# Patient Record
Sex: Male | Born: 1980
Health system: Southern US, Community
[De-identification: ages and names within clinical notes are randomized; demographics above are authoritative.]

---

## 2020-03-13 ENCOUNTER — Ambulatory Visit (INDEPENDENT_AMBULATORY_CARE_PROVIDER_SITE_OTHER): Payer: 59 | Admitting: Orthopedic Surgery

## 2020-03-13 ENCOUNTER — Ambulatory Visit (INDEPENDENT_AMBULATORY_CARE_PROVIDER_SITE_OTHER): Payer: 59

## 2020-03-13 DIAGNOSIS — M79605 Pain in left leg: Secondary | ICD-10-CM | POA: Diagnosis not present

## 2020-03-13 DIAGNOSIS — M25572 Pain in left ankle and joints of left foot: Secondary | ICD-10-CM

## 2020-03-17 ENCOUNTER — Encounter: Payer: Self-pay | Admitting: Orthopedic Surgery

## 2020-03-17 NOTE — Progress Notes (Signed)
Office Visit Note   Patient: Mark Hubbard           Date of Birth: 22-Aug-1981           MRN: [id number] Visit Date: 03/13/2020 Requested by: No referring provider defined for this encounter. PCP: Patient, No Pcp Per  Subjective: Chief Complaint  Patient presents with  . Left Ankle - Pain    HPI: Mark Hubbard is a 39 y.o. male who presents to the office complaining of left ankle pain.  Patient notes pain over the last month.  He had acute onset with no injury.  He had onset of left ankle pain when he was waiting a maternity ward for his wife to deliver.  He localizes pain to the medial aspect of the left ankle.  He did develop some focal medial swelling and redness.  He has no history of significant left ankle pain.  He has had a couple sprains in the past but no other injuries that he can recall.  He works as a Marine scientist.  He has tried Keflex and prednisone that provided some relief but did not resolve his symptoms.  He denies any history of gout.  He takes occasional Advil.  He initially was feeling the pain that he had to ambulate with a limp.  Swelling was reduced with prednisone course prescribed by general medical physician with whom he is related.  But recently returned about a week ago.  He denies any recent illnesses.  He initially had redness around the area of pain but this has resolved as well..                ROS: All systems reviewed are negative as they relate to the chief complaint within the history of present illness.  Patient denies fevers or chills.  Assessment & Plan: Visit Diagnoses:  1. Pain of joint of left ankle and foot   2. 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  He has had no injury.  Localizes pain to the medial aspect of the left ankle and has tenderness over the posterior tibialis tendon.  He has tried a prednisone course with some relief of pain but  has not resolved his symptoms.  Radiographs of the left ankle taken today are negative for any acute findings.  Ordered MRI of the left ankle for further evaluation of the medial malleolus and posterior tibialis tendon.  Patient agreed with plan.  Follow-up after MRI to review results.  Follow-Up Instructions: No follow-ups on file.   Orders:  Orders Placed This Encounter  Procedures  . XR Ankle Complete Left  . MR Ankle Left w/o contrast   No orders of the defined types were placed in this encounter.     Procedures: No procedures performed   Clinical Data: No additional findings.  Objective: Vital Signs: There were no vitals taken for this visit.  Physical Exam:  Constitutional: Patient appears well-developed HEENT:  Head: Normocephalic Eyes:EOM are normal Neck: Normal range of motion Cardiovascular: Normal rate Pulmonary/chest: Effort normal Neurologic: Patient is alert Skin: Skin is warm Psychiatric: Patient has normal mood and affect  Ortho Exam: Ortho exam demonstrates left ankle tenderness to palpation posterior to the medial malleolus.  Mild tenderness palpation over the insertion of the posterior tibialis tendon.  No tenderness palpation over the medial lateral malleoli, anterior ankle joint, anterior tibialis tendon, Achilles tendon, retrocalcaneal  space, fifth metatarsal base, Lisfranc complex, dorsal midfoot.  No pain with syndesmotic squeeze.  Active dorsiflexion, plantarflexion, inversion, eversion are intact.  No severe flatfoot deformity noted.  Specialty Comments:  No specialty comments available.  Imaging: No results found.   PMFS History: There are no problems to display for this patient.  No past medical history on file.  No family history on file.  History reviewed. No pertinent surgical history. Social History   Occupational History  . Not on file  Tobacco Use  . Smoking status: Not on file  Substance and Sexual Activity  . Alcohol use:  Not on file  . Drug use: Not on file  . Sexual activity: Not on file

## 2020-04-08 ENCOUNTER — Other Ambulatory Visit: Payer: 59

## 2020-05-08 ENCOUNTER — Other Ambulatory Visit: Payer: 59

## 2020-05-19 ENCOUNTER — Other Ambulatory Visit: Payer: Self-pay

## 2020-05-19 ENCOUNTER — Ambulatory Visit: Payer: 59 | Attending: Internal Medicine

## 2020-05-19 DIAGNOSIS — Z23 Encounter for immunization: Secondary | ICD-10-CM

## 2020-05-19 NOTE — Progress Notes (Signed)
   Covid-19 Vaccination Clinic  Name:  Mark Hubbard    MRN: [id number] DOB: 1981-07-06  05/19/2020  Mark Hubbard was observed post Covid-19 immunization for 15 minutes without incident. He was provided with Vaccine Information Sheet and instruction to access the V-Safe system.   Mark Hubbard was instructed to call 911 with any severe reactions post vaccine: Marland Kitchen Difficulty breathing  . Swelling of face and throat  . A fast heartbeat  . A bad rash all over body  . Dizziness and weakness

## 2020-07-09 DIAGNOSIS — H52222 Regular astigmatism, left eye: Secondary | ICD-10-CM | POA: Diagnosis not present

## 2020-07-09 DIAGNOSIS — H5211 Myopia, right eye: Secondary | ICD-10-CM | POA: Diagnosis not present

## 2020-07-09 DIAGNOSIS — H5212 Myopia, left eye: Secondary | ICD-10-CM | POA: Diagnosis not present

## 2020-07-11 DIAGNOSIS — Z20822 Contact with and (suspected) exposure to covid-19: Secondary | ICD-10-CM | POA: Diagnosis not present

## 2020-10-04 ENCOUNTER — Other Ambulatory Visit (HOSPITAL_COMMUNITY): Payer: Self-pay | Admitting: Occupational Medicine

## 2020-10-04 MED FILL — TIVICAY 50 MG TABLET: 50 | 5 days supply | Qty: 5 | Fill #0

## 2020-10-04 MED FILL — DESCOVY 200-25 MG TABS: 200-25 | 5 days supply | Qty: 5 | Fill #0

## 2020-10-07 ENCOUNTER — Other Ambulatory Visit (HOSPITAL_COMMUNITY): Payer: Self-pay | Admitting: Occupational Medicine

## 2020-10-07 MED FILL — ONDANSETRON HCL 4 MG TABLET: 4 | 3 days supply | Qty: 10 | Fill #0

## 2020-10-07 MED FILL — TIVICAY 50 MG TABLET: 50 | 23 days supply | Qty: 46 | Fill #0

## 2020-10-08 MED FILL — SYMTUZA 800-150-200-10 MG T: 800-150-200 | 23 days supply | Qty: 23 | Fill #0

## 2020-10-29 ENCOUNTER — Other Ambulatory Visit (HOSPITAL_COMMUNITY): Payer: Self-pay | Admitting: Occupational Medicine

## 2020-10-29 MED FILL — SYMTUZA 800-150-200-10 MG T: 800-150-200 | 2 days supply | Qty: 2 | Fill #0

## 2020-10-29 MED FILL — TIVICAY 50 MG TABLET: 50 | 4 days supply | Qty: 4 | Fill #0

## 2021-01-06 ENCOUNTER — Ambulatory Visit: Payer: 59 | Admitting: Family Medicine

## 2021-01-08 ENCOUNTER — Other Ambulatory Visit: Payer: Self-pay

## 2021-01-08 ENCOUNTER — Encounter: Payer: Self-pay | Admitting: Orthopaedic Surgery

## 2021-01-08 ENCOUNTER — Ambulatory Visit (INDEPENDENT_AMBULATORY_CARE_PROVIDER_SITE_OTHER): Payer: 59

## 2021-01-08 ENCOUNTER — Ambulatory Visit (INDEPENDENT_AMBULATORY_CARE_PROVIDER_SITE_OTHER): Payer: 59 | Admitting: Orthopaedic Surgery

## 2021-01-08 DIAGNOSIS — M25561 Pain in right knee: Secondary | ICD-10-CM | POA: Diagnosis not present

## 2021-01-08 NOTE — Progress Notes (Signed)
Office Visit Note   Patient: Mark Hubbard           Date of Birth: 1980-09-08           MRN: [id number] Visit Date: 01/08/2021              Requested by: No referring provider defined for this encounter. PCP: Patient, No Pcp Per (Inactive)   Assessment & Plan: Visit Diagnoses:  1. Acute pain of right knee     Plan: Impression is MCL sprain likely grade 1.  X-rays are unremarkable.  I would recommend symptomatic treatment with ice and NSAIDs and he will continue to work on getting his knee straight.  I did hand him home exercises to try to do once he starts to feel improvement.  If he does not feel any improvement over the next 3 to 4 weeks we may need to consider an MRI.  Follow-Up Instructions: Return if symptoms worsen or fail to improve.   Orders:  Orders Placed This Encounter  Procedures  . XR Knee 1-2 Views Right   No orders of the defined types were placed in this encounter.     Procedures: No procedures performed   Clinical Data: No additional findings.   Subjective: Chief Complaint  Patient presents with  . 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  He was going down steps when he missed the edge of the steps and slipped down a few of the steps and then planted with the knee in valgus.  He is not sure if it ever swelled up.  He has noticed some decreased range of motion especially with full extension.  He has been limping as a result of inability to fully extend his knee.  He was taking ibuprofen at beginning.  Not really endorsing any mechanical symptoms.  He does feel some instability when he is changing directions.   Review of Systems  Constitutional: Negative.   All other systems reviewed and are negative.    Objective: Vital Signs: There were no vitals taken for this visit.  Physical Exam Vitals and  nursing note reviewed.  Constitutional:      Appearance: He is well-developed.  HENT:     Head: Normocephalic and atraumatic.  Eyes:     Pupils: Pupils are equal, round, and reactive to light.  Pulmonary:     Effort: Pulmonary effort is normal.  Abdominal:     Palpations: Abdomen is soft.  Musculoskeletal:        General: Normal range of motion.     Cervical back: Neck supple.  Skin:    General: Skin is warm.  Neurological:     Mental Status: He is alert and oriented to person, place, and time.  Psychiatric:        Behavior: Behavior normal.        Thought Content: Thought content normal.        Judgment: Judgment normal.     Ortho Exam Right knee shows no joint effusion.  He is tender over the femoral insertion of the MCL.  There is no laxity to the collaterals or cruciates.  He has essentially normal flexion but he lacks about 15 to 20 degrees of full extension due to pain and guarding but I feel that without the pain I would be able to get him  fully straight.  No significant medial joint line tenderness.  Negative McMurray.  Posterior lateral corner feels stable.  Lateral tibial plateau is nontender.  Patella tracking is normal. Specialty Comments:  No specialty comments available.  Imaging: XR Knee 1-2 Views Right  Result Date: 01/08/2021 No acute or structural abnormalities.    PMFS History: There are no problems to display for this patient.  History reviewed. No pertinent past medical history.  History reviewed. No pertinent family history.  History reviewed. No pertinent surgical history. Social History   Occupational History  . Not on file  Tobacco Use  . Smoking status: Not on file  . Smokeless tobacco: Not on file  Substance and Sexual Activity  . Alcohol use: Not on file  . Drug use: Not on file  . Sexual activity: Not on file

## 2021-01-27 ENCOUNTER — Other Ambulatory Visit: Payer: Self-pay

## 2021-01-27 ENCOUNTER — Telehealth: Payer: Self-pay | Admitting: Orthopaedic Surgery

## 2021-01-27 DIAGNOSIS — M25561 Pain in right knee: Secondary | ICD-10-CM

## 2021-01-27 NOTE — Telephone Encounter (Signed)
Yes please thanks.

## 2021-01-27 NOTE — Telephone Encounter (Signed)
Okay to do order ?  

## 2021-01-27 NOTE — Telephone Encounter (Signed)
Patient called requesting Dr. Roda Shutters send a referral for MRI. Please call patient about this matter at [phone]

## 2021-01-27 NOTE — Telephone Encounter (Signed)
Order made. They will contact patient to schedule appt .

## 2021-02-05 ENCOUNTER — Other Ambulatory Visit: Payer: Self-pay

## 2021-02-05 ENCOUNTER — Ambulatory Visit
Admission: RE | Admit: 2021-02-05 | Discharge: 2021-02-05 | Disposition: A | Discharge status: Home / Self Care | Payer: 59 | Source: Ambulatory Visit | Attending: Orthopaedic Surgery | Admitting: Orthopaedic Surgery

## 2021-02-05 DIAGNOSIS — S8001XA Contusion of right knee, initial encounter: Secondary | ICD-10-CM | POA: Diagnosis not present

## 2021-02-05 DIAGNOSIS — M25561 Pain in right knee: Secondary | ICD-10-CM

## 2021-05-27 ENCOUNTER — Other Ambulatory Visit (HOSPITAL_COMMUNITY): Payer: Self-pay

## 2021-05-27 MED ORDER — AZITHROMYCIN 250 MG PO TABS
ORAL_TABLET | ORAL | 0 refills | Status: AC
  Filled 2021-05-27: qty 6, 5d supply, fill #0

## 2021-08-25 ENCOUNTER — Other Ambulatory Visit: Payer: Self-pay | Admitting: Internal Medicine

## 2021-09-10 ENCOUNTER — Encounter (HOSPITAL_COMMUNITY): Payer: Self-pay | Admitting: Emergency Medicine

## 2021-09-10 ENCOUNTER — Ambulatory Visit (HOSPITAL_COMMUNITY)
Admission: RE | Admit: 2021-09-10 | Discharge: 2021-09-10 | Disposition: A | Discharge status: Home / Self Care | Payer: BC Managed Care – PPO | Source: Ambulatory Visit | Attending: Student | Admitting: Student

## 2021-09-10 ENCOUNTER — Other Ambulatory Visit: Payer: Self-pay

## 2021-09-10 ENCOUNTER — Telehealth: Payer: Self-pay | Admitting: Student

## 2021-09-10 ENCOUNTER — Emergency Department (HOSPITAL_COMMUNITY)
Admission: EM | Admit: 2021-09-10 | Discharge: 2021-09-10 | Disposition: A | Discharge status: Home / Self Care | Payer: BC Managed Care – PPO | Attending: Emergency Medicine | Admitting: Emergency Medicine

## 2021-09-10 DIAGNOSIS — R059 Cough, unspecified: Secondary | ICD-10-CM | POA: Diagnosis not present

## 2021-09-10 DIAGNOSIS — R011 Cardiac murmur, unspecified: Secondary | ICD-10-CM | POA: Diagnosis not present

## 2021-09-10 DIAGNOSIS — J189 Pneumonia, unspecified organism: Secondary | ICD-10-CM

## 2021-09-10 DIAGNOSIS — R0789 Other chest pain: Secondary | ICD-10-CM | POA: Diagnosis not present

## 2021-09-10 DIAGNOSIS — R0602 Shortness of breath: Secondary | ICD-10-CM | POA: Diagnosis not present

## 2021-09-10 DIAGNOSIS — R072 Precordial pain: Secondary | ICD-10-CM | POA: Insufficient documentation

## 2021-09-10 LAB — CBC WITH DIFFERENTIAL/PLATELET
Abs Immature Granulocytes: 0.02 10*3/uL (ref 0.00–0.07)
Basophils Absolute: 0 10*3/uL (ref 0.0–0.1)
Basophils Relative: 0 %
Eosinophils Absolute: 0 10*3/uL (ref 0.0–0.5)
Eosinophils Relative: 0 %
HCT: 44.7 % (ref 39.0–52.0)
Hemoglobin: 14.7 g/dL (ref 13.0–17.0)
Immature Granulocytes: 0 %
Lymphocytes Relative: 17 %
Lymphs Abs: 1.4 10*3/uL (ref 0.7–4.0)
MCH: 29.5 pg (ref 26.0–34.0)
MCHC: 32.9 g/dL (ref 30.0–36.0)
MCV: 89.8 fL (ref 80.0–100.0)
Monocytes Absolute: 1 10*3/uL (ref 0.1–1.0)
Monocytes Relative: 12 %
Neutro Abs: 5.7 10*3/uL (ref 1.7–7.7)
Neutrophils Relative %: 71 %
Platelets: 257 10*3/uL (ref 150–400)
RBC: 4.98 MIL/uL (ref 4.22–5.81)
RDW: 12.7 % (ref 11.5–15.5)
WBC: 8.2 10*3/uL (ref 4.0–10.5)
nRBC: 0 % (ref 0.0–0.2)

## 2021-09-10 LAB — COMPREHENSIVE METABOLIC PANEL
ALT: 19 U/L (ref 0–44)
AST: 21 U/L (ref 15–41)
Albumin: 4.2 g/dL (ref 3.5–5.0)
Alkaline Phosphatase: 51 U/L (ref 38–126)
Anion gap: 5 (ref 5–15)
BUN: 19 mg/dL (ref 6–20)
CO2: 26 mmol/L (ref 22–32)
Calcium: 9.2 mg/dL (ref 8.9–10.3)
Chloride: 108 mmol/L (ref 98–111)
Creatinine, Ser: 0.71 mg/dL (ref 0.61–1.24)
GFR, Estimated: >60 mL/min (ref >60)
Glucose, Bld: 110 mg/dL — ABNORMAL HIGH (ref 70–99)
Potassium: 3.9 mmol/L (ref 3.5–5.1)
Sodium: 139 mmol/L (ref 135–145)
Total Bilirubin: 1 mg/dL (ref 0.3–1.2)
Total Protein: 7.1 g/dL (ref 6.5–8.1)

## 2021-09-10 LAB — TROPONIN I (HIGH SENSITIVITY): Troponin I (High Sensitivity): 3 ng/L (ref <18)

## 2021-09-10 LAB — D-DIMER, QUANTITATIVE: D-Dimer, Quant: <0.27 ug/mL-FEU (ref 0.00–0.50)

## 2021-09-10 NOTE — ED Provider Triage Note (Addendum)
Emergency Medicine Provider Triage Evaluation Note  Mark Hubbard , a 41 y.o. male  was evaluated in triage.  Pt complains of chest pain.  Pt has pain on right side.  Pain began suddenly at 4am.   Review of Systems  Positive: cough Negative: No fever  Physical Exam  BP (!) 159/95    Pulse 83    Temp 98.3 F (36.8 C) (Oral)    Resp 16    SpO2 100% Gen:   Awake, no distress   Resp:  Normal effort.  Rub left chest  MSK:   Moves extremities without difficulty  Other:    Medical Decision Making  Medically screening exam initiated at 3:00 PM.  Appropriate orders placed.  Mark Hubbard was informed that the remainder of the evaluation will be completed by another provider, this initial triage assessment does not replace that evaluation, and the importance of remaining in the ED until their evaluation is complete.  Chest xray shows possible right lobe pneumonia.  Radiologist request lateral view Charge Rn asked to room pt    Mark Hubbard 09/10/21 [place], [place] 09/10/21 [id number]

## 2021-09-10 NOTE — Discharge Instructions (Signed)
Please follow-up with cardiology as planned.  Return with any new or suddenly worsening symptoms.

## 2021-09-10 NOTE — ED Provider Notes (Signed)
Emergency Department Provider Note   I have reviewed the triage vital signs and the nursing notes.   HISTORY  Chief Complaint Chest Pain   HPI Mark Hubbard is a 41 y.o. male presents to the ED with acute onset CP starting a 3:30 AM. Patient notes this woke him from sleep. Describes a sharp CP radiating into the right arm. No numbness/weakness. Notes a slight pleuritic component. No leg pain or swelling. No recent travel or surgery. No recent URI.     History reviewed. No pertinent past medical history.  Review of Systems  Constitutional: No fever/chills Eyes: No visual changes. ENT: No sore throat. Cardiovascular: Positive chest pain. Respiratory: Denies shortness of breath. Gastrointestinal: No abdominal pain.  No nausea, no vomiting.  No diarrhea.  No constipation. Genitourinary: Negative for dysuria. Musculoskeletal: Negative for back pain. Skin: Negative for rash. Neurological: Negative for headaches, focal weakness or numbness.  10-point ROS otherwise negative.  ____________________________________________   PHYSICAL EXAM:  VITAL SIGNS: ED Triage Vitals  Enc Vitals Group     BP 09/10/21 1410 (!) 159/95     Pulse Rate 09/10/21 1410 83     Resp 09/10/21 1410 16     Temp 09/10/21 1410 98.3 F (36.8 C)     Temp Source 09/10/21 1410 Oral     SpO2 09/10/21 1410 100 %   Constitutional: Alert and oriented. Well appearing and in no acute distress. Eyes: Conjunctivae are normal.  Head: Atraumatic. Nose: No congestion/rhinnorhea. Mouth/Throat: Mucous membranes are moist.  Neck: No stridor.  Cardiovascular: Normal rate, regular rhythm. Good peripheral circulation. Diastolic murmur noted with faint rub best heard in the left middle chest.   Respiratory: Normal respiratory effort.  No retractions. Lungs CTAB. Gastrointestinal: Soft and nontender. No distention.  Musculoskeletal: No gross deformities of extremities. Neurologic:  Normal speech and language.   Skin:  Skin is warm, dry and intact. No rash noted.   ____________________________________________   LABS (all labs ordered are listed, but only abnormal results are displayed)  Labs Reviewed  COMPREHENSIVE METABOLIC PANEL - Abnormal; Notable for the following components:      Result Value   Glucose, Bld 110 (*)    All other components within normal limits  RESP PANEL BY RT-PCR (FLU A&B, COVID) ARPGX2  CBC WITH DIFFERENTIAL/PLATELET  D-DIMER, QUANTITATIVE  TROPONIN I (HIGH SENSITIVITY)   ____________________________________________  EKG   EKG Interpretation  Date/Time:  Wednesday September 10 2021 18:44:00 EST Ventricular Rate:  91 PR Interval:  164 QRS Duration: 88 QT Interval:  348 QTC Calculation: 428 R Axis:   79 Text Interpretation: Normal sinus rhythm Septal infarct , age undetermined Abnormal ECG No previous ECGs available Confirmed by Richardean Canal [phone]) on 09/11/2021 6:22:42 PM        ____________________________________________  RADIOLOGY  DG Chest 1 View  Result Date: 09/10/2021 CLINICAL DATA:  Cough and shortness of breath EXAM: CHEST  1 VIEW COMPARISON:  None. FINDINGS: Heart size is normal. Mediastinal shadows are normal. Heart border is indistinct on the right, suggesting right middle lobe pneumonia. There may also be minimal patchy density at the left base. Lateral view would be useful, as these findings can be seen with narrow AP diameter of the chest. No effusion. No focal bone finding. IMPRESSION: Loss of the right heart border suggesting right middle lobe infiltrate. Question minimal patchy density at the left base. Lateral view would be useful for complete evaluation. Electronically Signed   By: Scherrie Bateman.D.  On: 09/10/2021 14:37    ____________________________________________   PROCEDURES  Procedure(s) performed:   Procedures  None ____________________________________________   INITIAL IMPRESSION / ASSESSMENT AND PLAN / ED  COURSE  Pertinent labs & imaging results that were available during my care of the patient were reviewed by me and considered in my medical decision making (see chart for details).   This patient is Presenting for Evaluation of CP, which does require a range of treatment options, and is a complaint that involves a high risk of morbidity and mortality.  The Differential Diagnoses includes all life-threatening causes for chest pain. This includes but is not exclusive to acute coronary syndrome, aortic dissection, pulmonary embolism, cardiac tamponade, community-acquired pneumonia, pericarditis, musculoskeletal chest wall pain, etc.   Medical Decision Making: Summary:  Patient awoke with pain at 3:30 AM. Low suspicion for ACS with fairly atypical component to pain. EKG without acute ischemic change but no prior for comparison. See me independent interpretation as above. D-dimer ordered to further risk stratify for PE but patient is overall low risk.     Reevaluation with update and discussion with patient and wife. Plan for d/c with close Cardiology follow up and strict ED return precautions. No infection symptoms. Patient has a Z-pack at home that he will consider starting. Plan for NSAIDs and Cardiology follow up.    I did Additional Historical Information from wife who confirms pain onset symptoms. She has reached out to Dr. Carmon Ginsberg who will come to the ED for bedside ECHO.     Clinical Laboratory Tests Ordered, included CBC, CMP, troponin, and d-dimer. Troponin is negative. D-dimer with WNL. No AKI or electrolyte abnormality.   Radiologic Tests Ordered, included CXR which I independently reviewed. No obvious infiltrate. Reviewed radiology interpretation.   ____________________________________________  FINAL CLINICAL IMPRESSION(S) / ED DIAGNOSES  Final diagnoses:  Precordial chest pain  Heart murmur     Note:  This document was prepared using Dragon voice recognition software and may  include unintentional dictation errors.  Alona Bene, MD, [facility] Emergency Medicine    Mark Hubbard, Mark Repress, MD 09/14/21 [id number]

## 2021-09-10 NOTE — ED Triage Notes (Signed)
Patient here with complaint of chest pain that started at 0330 this morning that woke him from sleep this morning. Pain radiates into right arm. Patient alert, oriented nad in no apaprent distress at this time.3

## 2021-09-11 ENCOUNTER — Other Ambulatory Visit: Payer: Self-pay | Admitting: Cardiovascular Disease

## 2021-09-11 ENCOUNTER — Ambulatory Visit (HOSPITAL_COMMUNITY)
Admission: RE | Admit: 2021-09-11 | Discharge: 2021-09-11 | Disposition: A | Discharge status: Home / Self Care | Payer: BC Managed Care – PPO | Source: Ambulatory Visit | Attending: Cardiovascular Disease | Admitting: Cardiovascular Disease

## 2021-09-11 DIAGNOSIS — R011 Cardiac murmur, unspecified: Secondary | ICD-10-CM | POA: Insufficient documentation

## 2021-09-11 DIAGNOSIS — I341 Nonrheumatic mitral (valve) prolapse: Secondary | ICD-10-CM | POA: Insufficient documentation

## 2021-09-11 DIAGNOSIS — I340 Nonrheumatic mitral (valve) insufficiency: Secondary | ICD-10-CM | POA: Diagnosis not present

## 2021-09-11 DIAGNOSIS — R079 Chest pain, unspecified: Secondary | ICD-10-CM | POA: Insufficient documentation

## 2021-09-11 LAB — ECHOCARDIOGRAM COMPLETE
Area-P 1/2: 4.49 cm2
Calc EF: 64.1 %
S' Lateral: 2.4 cm
Single Plane A2C EF: 66.2 %
Single Plane A4C EF: 61.4 %

## 2021-09-11 NOTE — Progress Notes (Signed)
Echo ordered stat for chest pain.

## 2021-09-11 NOTE — Telephone Encounter (Signed)
Error

## 2021-09-12 NOTE — Progress Notes (Signed)
Cardiology Office Note:   Date:  09/15/2021  NAME:  Mark Hubbard    MRN: [id number] DOB:  1981-08-18   PCP:  Patient, No Pcp Per (Inactive)  Cardiologist:  None  Electrophysiologist:  None   Referring MD: No ref. provider found   Chief Complaint  Patient presents with   Chest Pain   History of Present Illness:   Mark Hubbard is a 41 y.o. male without medical history who presents for evaluation of chest pain.  Developed pleuritic chest pain last week.  Evaluated in the emergency room on 09/10/2021.  Normal kidney profile.  Troponins negative.  Chest x-ray normal.  Echocardiogram was obtained which shows mild mitral valve prolapse but normal LV/RV function.   He reports last week he developed dull achy chest pain.  Described as occurring in the entire chest.  Worse with deep inspiration.  He reports it may have been worse with laying back.  Activity is not exacerbated other than standing.  It was not exertional.  He is not alleviated by rest.  Symptoms are rather constant.  They were coming and going.  He was seen in the emergency room.  Work-up was negative.  Echo was obtained which shows atrial valve prolapse.  EKG really unremarkable in the emergency room as well.  He reports he is feeling better.  He did take ibuprofen for 2 to 3 days.  Symptoms have resolved.  He is also on a Z-Pak.  Chest x-ray was unremarkable.  He has no medical problems.  Denies any significant heart history.  Mother does have a history of mitral valve prolapse status post mitral valve repair.  He does have mitral valve prolapse on his echocardiogram.  He has mild mitral valve regurgitation.  He does have a faint murmur on exam today but nothing worrisome.  He works as a Armed forces logistics/support/administrative officer.  He is married.  He has 1 daughter.   Past Medical History: History reviewed. No pertinent past medical history.  Past Surgical History: History reviewed. No pertinent surgical history.  Current Medications: No outpatient  medications have been marked as taking for the 09/15/21 encounter (Office Visit) with Geralynn Rile, MD.     Allergies:    Patient has no known allergies.   Social History: Social History   Socioeconomic History   Marital status: Married    Spouse name: Not on file   Number of children: 1   Years of education: Not on file   Highest education level: Not on file  Occupational History   Occupation: Radiologist  Tobacco Use   Smoking status: Never   Smokeless tobacco: Never  Substance and Sexual Activity   Alcohol use: Never   Drug use: Never   Sexual activity: Yes  Other Topics Concern   Not on file  Social History Narrative   Not on file   Social Determinants of Health   Financial Resource Strain: Not on file  Food Insecurity: Not on file  Transportation Needs: Not on file  Physical Activity: Not on file  Stress: Not on file  Social Connections: Not on file     Family History: The patient's family history includes Hypertension in his father; Mitral valve prolapse in his mother.  ROS:   All other ROS reviewed and negative. Pertinent positives noted in the HPI.     EKGs/Labs/Other Studies Reviewed:   The following studies were personally reviewed by me today:  TTE 09/11/2021  1. Left ventricular ejection fraction, by estimation, is 60 to  65%. The  left ventricle has normal function. The left ventricle has no regional  wall motion abnormalities. Left ventricular diastolic parameters were  normal. The average left ventricular  global longitudinal strain is -17.4 %. The global longitudinal strain is  normal.   2. Right ventricular systolic function is normal. The right ventricular  size is normal.   3. The mitral valve is normal in structure. Mild mitral valve  regurgitation. No evidence of mitral stenosis. There is mild holosystolic  prolapse of the middle segment of the anterior leaflet of the mitral  valve.   4. The aortic valve is normal in structure.  Aortic valve regurgitation is  not visualized. No aortic stenosis is present.   5. The inferior vena cava is normal in size with greater than 50%  respiratory variability, suggesting right atrial pressure of 3 mmHg.   Recent Labs: 09/10/2021: ALT 19; BUN 19; Creatinine, Ser 0.71; Hemoglobin 14.7; Platelets 257; Potassium 3.9; Sodium 139   Recent Lipid Panel No results found for: CHOL, TRIG, HDL, CHOLHDL, VLDL, LDLCALC, LDLDIRECT  Physical Exam:   VS:  BP 105/68    Pulse 84    Ht 6' (1.829 m)    Wt 127 lb 6.4 oz (57.8 kg)    SpO2 98%    BMI 17.28 kg/m    Wt Readings from Last 3 Encounters:  09/15/21 127 lb 6.4 oz (57.8 kg)    General: Well nourished, well developed, in no acute distress Head: Atraumatic, normal size  Eyes: PEERLA, EOMI  Neck: Supple, no JVD Endocrine: No thryomegaly Cardiac: Normal S1, S2; RRR; 2 out of 6 holosystolic murmur Lungs: Clear to auscultation bilaterally, no wheezing, rhonchi or rales  Abd: Soft, nontender, no hepatomegaly  Ext: No edema, pulses 2+ Musculoskeletal: No deformities, BUE and BLE strength normal and equal Skin: Warm and dry, no rashes   Neuro: Alert and oriented to person, place, time, and situation, CNII-XII grossly intact, no focal deficits  Psych: Normal mood and affect   ASSESSMENT:   Mark Hubbard is a 41 y.o. male who presents for the following: 1. Precordial pain   2. Mitral valve prolapse     PLAN:   1. Precordial pain -Noncardiac chest pain.  Described as dull.  Worse with inspiration.  Worse with position.  EKG in the emergency room and consistent with pericarditis.  He did take ibuprofen for 2 to 3 days and symptoms have resolved.  I recommended to check an ESR and CRP just to make sure this is not pericarditis.  His echocardiogram was unremarkable.  He has no evidence of a dilated aortic root.  Mitral valve shows prolapse but mild regurgitation.  No significant pathology found.  Symptoms could just be related to costochondritis.   He does report a recent viral URI.  We will make sure there is no pericarditis with lab work today.  2. Mitral valve prolapse -Mild mitral valve prolapse.  Mild regurgitation noted.  Would recommend an echocardiogram in 3 to 5 years.  He denies any symptoms from this.  Mother did have history of mitral valve repair.  This may be in his future.  Given mild regurgitation would recommend to repeat an echo in 3 to 5 years.  Disposition: Return if symptoms worsen or fail to improve.  Medication Adjustments/Labs and Tests Ordered: Current medicines are reviewed at length with the patient today.  Concerns regarding medicines are outlined above.  Orders Placed This Encounter  Procedures   Sedimentation rate   C-reactive  protein   No orders of the defined types were placed in this encounter.   Patient Instructions  Medication Instructions:  The current medical regimen is effective;  continue present plan and medications.  *If you need a refill on your cardiac medications before your next appointment, please call your pharmacy*   Lab Work: ESR, CRP today   If you have labs (blood work) drawn today and your tests are completely normal, you will receive your results only by: [place] (if you have MyChart) OR A paper copy in the mail If you have any lab test that is abnormal or we need to change your treatment, we will call you to review the results.   Follow-Up: At [facility], you and your health needs are our priority.  As part of our continuing mission to provide you with exceptional heart care, we have created designated Provider Care Teams.  These Care Teams include your primary Cardiologist (physician) and Advanced Practice Providers (APPs -  Physician Assistants and Nurse Practitioners) who all work together to provide you with the care you need, when you need it.  We recommend signing up for the patient portal called "MyChart".  Sign up information is provided on this After  Visit Summary.  MyChart is used to connect with patients for Virtual Visits (Telemedicine).  Patients are able to view lab/test results, encounter notes, upcoming appointments, etc.  Non-urgent messages can be sent to your provider as well.   To learn more about what you can do with MyChart, go to [email or web address].    Your next appointment:   As needed  The format for your next appointment:   In Person  Provider:   Eleonore Chiquito, MD      Signed, Addison Naegeli. Audie Box, MD, [place]  [address], [place], [place] [address] [phone]  09/15/2021 9:57 AM

## 2021-09-15 ENCOUNTER — Ambulatory Visit (INDEPENDENT_AMBULATORY_CARE_PROVIDER_SITE_OTHER): Payer: BC Managed Care – PPO | Admitting: Cardiovascular Disease

## 2021-09-15 ENCOUNTER — Other Ambulatory Visit: Payer: Self-pay

## 2021-09-15 ENCOUNTER — Encounter: Payer: Self-pay | Admitting: Cardiovascular Disease

## 2021-09-15 VITALS — BP 105/68 | HR 84 | Ht 72.0 in | Wt 127.4 lb

## 2021-09-15 DIAGNOSIS — R072 Precordial pain: Secondary | ICD-10-CM

## 2021-09-15 DIAGNOSIS — I341 Nonrheumatic mitral (valve) prolapse: Secondary | ICD-10-CM | POA: Diagnosis not present

## 2021-09-15 NOTE — Patient Instructions (Signed)
Medication Instructions:  The current medical regimen is effective;  continue present plan and medications.  *If you need a refill on your cardiac medications before your next appointment, please call your pharmacy*   Lab Work: ESR, CRP today   If you have labs (blood work) drawn today and your tests are completely normal, you will receive your results only by: [place] (if you have MyChart) OR A paper copy in the mail If you have any lab test that is abnormal or we need to change your treatment, we will call you to review the results.   Follow-Up: At [facility], you and your health needs are our priority.  As part of our continuing mission to provide you with exceptional heart care, we have created designated Provider Care Teams.  These Care Teams include your primary Cardiologist (physician) and Advanced Practice Providers (APPs -  Physician Assistants and Nurse Practitioners) who all work together to provide you with the care you need, when you need it.  We recommend signing up for the patient portal called "MyChart".  Sign up information is provided on this After Visit Summary.  MyChart is used to connect with patients for Virtual Visits (Telemedicine).  Patients are able to view lab/test results, encounter notes, upcoming appointments, etc.  Non-urgent messages can be sent to your provider as well.   To learn more about what you can do with MyChart, go to [email or web address].    Your next appointment:   As needed  The format for your next appointment:   In Person  Provider:   Eleonore Chiquito, MD

## 2021-09-16 LAB — SEDIMENTATION RATE: Sed Rate: 2 mm/hr (ref 0–15)

## 2021-09-16 LAB — C-REACTIVE PROTEIN: CRP: 11 mg/L — ABNORMAL HIGH (ref 0–10)

## 2022-03-16 DIAGNOSIS — M791 Myalgia, unspecified site: Secondary | ICD-10-CM | POA: Diagnosis not present

## 2022-03-16 DIAGNOSIS — Z0000 Encounter for general adult medical examination without abnormal findings: Secondary | ICD-10-CM | POA: Diagnosis not present

## 2022-03-16 DIAGNOSIS — Z13220 Encounter for screening for lipoid disorders: Secondary | ICD-10-CM | POA: Diagnosis not present

## 2022-09-06 IMAGING — DX DG CHEST 1V
1 series · 1 of 1 positions shown · non-contrast
Comparison: None.

CLINICAL DATA: Cough and shortness of breath

EXAM:
CHEST  1 VIEW

[chest pa]
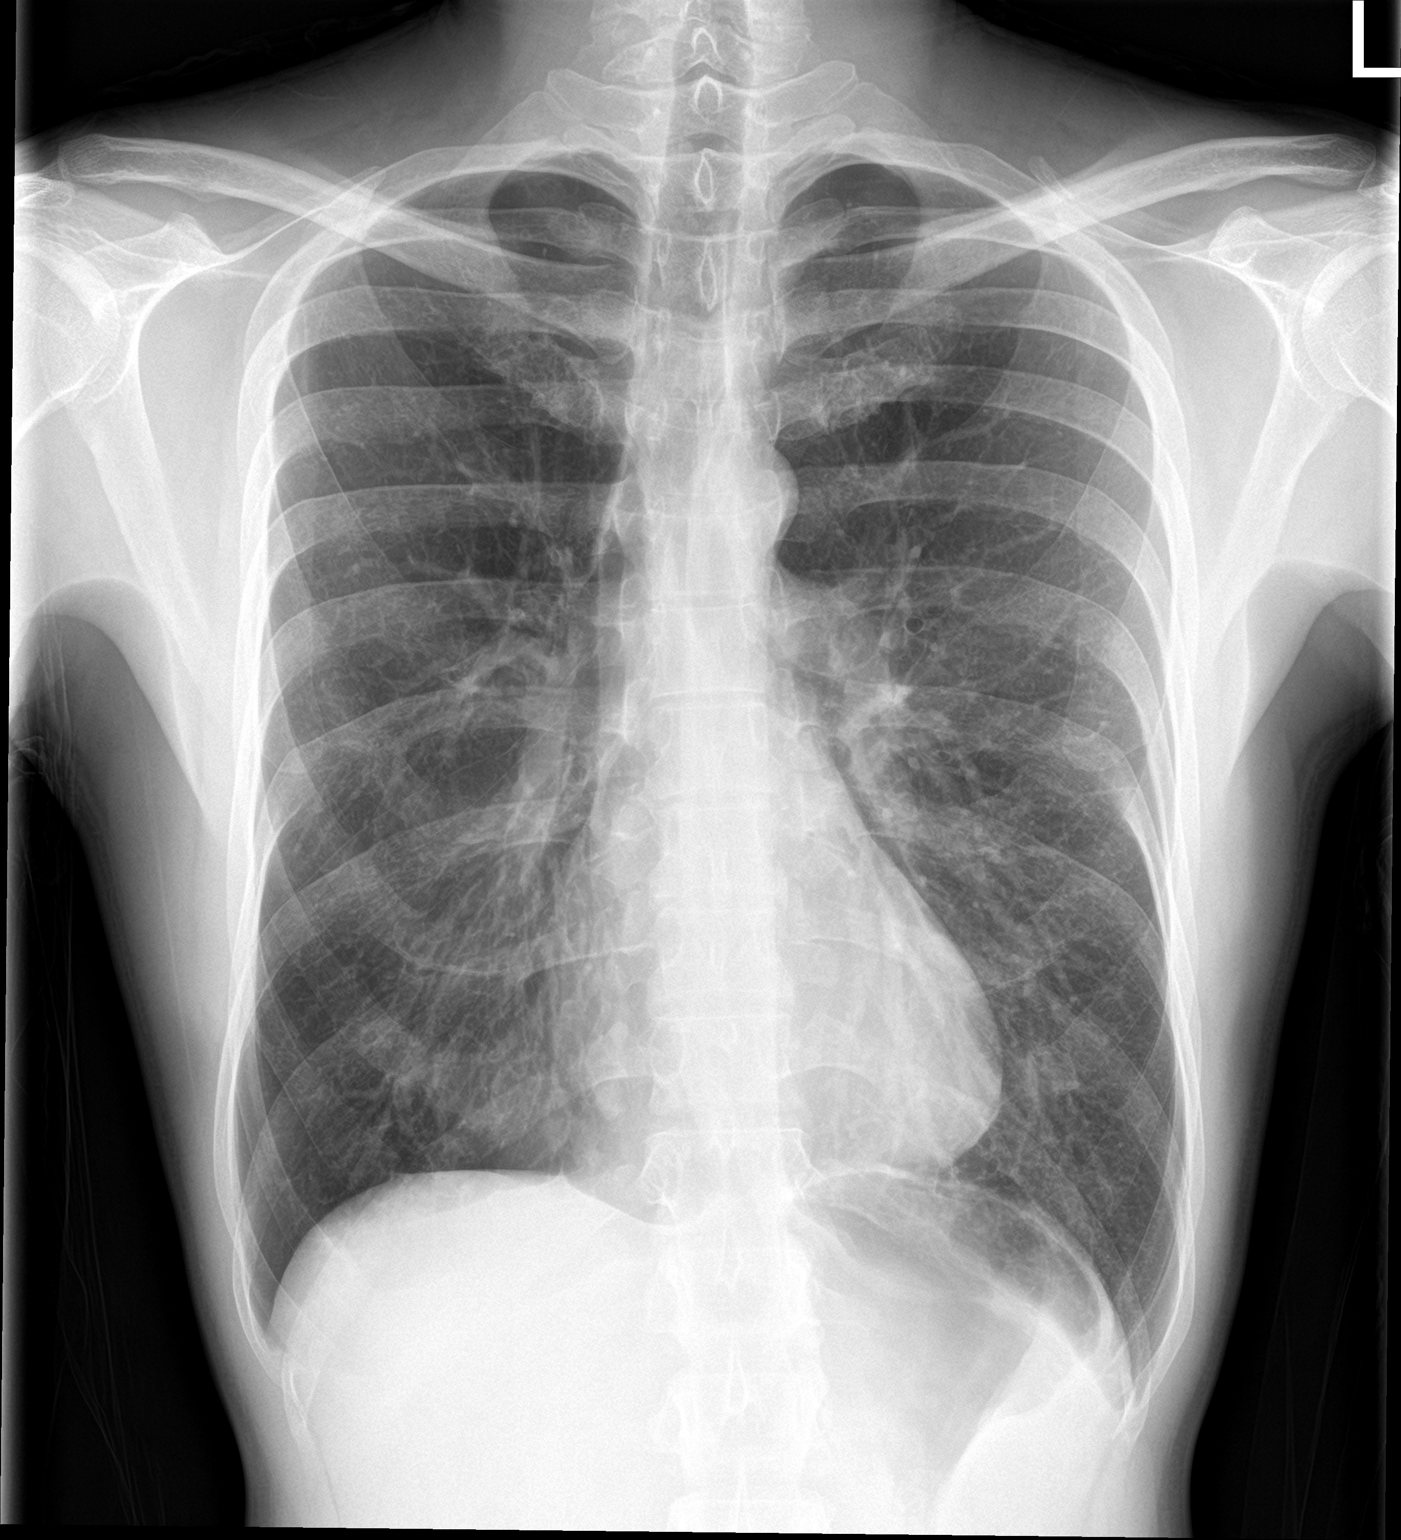

[1 of 1 positions shown; findings below may reference images not displayed]

FINDINGS: Heart size is normal. Mediastinal shadows are normal. Heart border
is indistinct on the right, suggesting right middle lobe pneumonia.
There may also be minimal patchy density at the left base. Lateral
view would be useful, as these findings can be seen with narrow AP
diameter of the chest. No effusion. No focal bone finding.
IMPRESSION: Loss of the right heart border suggesting right middle lobe
infiltrate. Question minimal patchy density at the left base.
Lateral view would be useful for complete evaluation.
